# Patient Record
Sex: Female | Born: 1960 | Race: Black or African American | Hispanic: No | Smoking: Never smoker
Health system: Southern US, Community
[De-identification: ages and names within clinical notes are randomized; demographics above are authoritative.]

## PROBLEM LIST (undated history)

## (undated) DIAGNOSIS — I10 Essential (primary) hypertension: Secondary | ICD-10-CM

## (undated) HISTORY — PX: TUBAL LIGATION: SHX77

---

## 2008-12-25 ENCOUNTER — Other Ambulatory Visit: Admission: RE | Admit: 2008-12-25 | Discharge: 2008-12-25 | Payer: Self-pay | Admitting: Family Medicine

## 2009-11-24 ENCOUNTER — Other Ambulatory Visit: Admission: RE | Admit: 2009-11-24 | Discharge: 2009-11-24 | Payer: Self-pay | Admitting: Family Medicine

## 2011-06-11 ENCOUNTER — Observation Stay (HOSPITAL_COMMUNITY)
Admission: EM | Admit: 2011-06-11 | Discharge: 2011-06-12 | Disposition: A | Discharge status: Home / Self Care | Payer: BC Managed Care – PPO | Attending: Emergency Medicine | Admitting: Emergency Medicine

## 2011-06-11 ENCOUNTER — Other Ambulatory Visit: Payer: Self-pay

## 2011-06-11 ENCOUNTER — Encounter (HOSPITAL_COMMUNITY): Payer: Self-pay | Admitting: Emergency Medicine

## 2011-06-11 DIAGNOSIS — I10 Essential (primary) hypertension: Secondary | ICD-10-CM | POA: Insufficient documentation

## 2011-06-11 DIAGNOSIS — R079 Chest pain, unspecified: Principal | ICD-10-CM | POA: Insufficient documentation

## 2011-06-11 DIAGNOSIS — E876 Hypokalemia: Secondary | ICD-10-CM

## 2011-06-11 HISTORY — DX: Essential (primary) hypertension: I10

## 2011-06-11 NOTE — ED Notes (Signed)
Patient with tightness in chest for last few days, states that she did go to PCP and it has continued and getting worse.  She states that she has some fatigue with the tightness.

## 2011-06-12 ENCOUNTER — Other Ambulatory Visit: Payer: Self-pay

## 2011-06-12 ENCOUNTER — Emergency Department (HOSPITAL_COMMUNITY): Payer: BC Managed Care – PPO

## 2011-06-12 LAB — POCT I-STAT TROPONIN I
Troponin i, poc: 0 ng/mL (ref 0.00–0.08)
Troponin i, poc: 0 ng/mL (ref 0.00–0.08)

## 2011-06-12 LAB — POCT I-STAT, CHEM 8
BUN: 10 mg/dL (ref 6–23)
Calcium, Ion: 1.2 mmol/L (ref 1.12–1.32)
Chloride: 107 mEq/L (ref 96–112)
Creatinine, Ser: 0.8 mg/dL (ref 0.50–1.10)
Glucose, Bld: 109 mg/dL — ABNORMAL HIGH (ref 70–99)
HCT: 34 % — ABNORMAL LOW (ref 36.0–46.0)
Hemoglobin: 11.6 g/dL — ABNORMAL LOW (ref 12.0–15.0)
Potassium: 3 mEq/L — ABNORMAL LOW (ref 3.5–5.1)
Sodium: 141 mEq/L (ref 135–145)
TCO2: 23 mmol/L (ref 0–100)

## 2011-06-12 LAB — CBC
HCT: 32.6 % — ABNORMAL LOW (ref 36.0–46.0)
Hemoglobin: 11.1 g/dL — ABNORMAL LOW (ref 12.0–15.0)
MCH: 28.8 pg (ref 26.0–34.0)
MCHC: 34 g/dL (ref 30.0–36.0)
MCV: 84.7 fL (ref 78.0–100.0)
Platelets: 283 10*3/uL (ref 150–400)
RBC: 3.85 MIL/uL — ABNORMAL LOW (ref 3.87–5.11)
RDW: 14.9 % (ref 11.5–15.5)
WBC: 7.8 10*3/uL (ref 4.0–10.5)

## 2011-06-12 LAB — DIFFERENTIAL
Basophils Relative: 0 % (ref 0–1)
Eosinophils Absolute: 0.3 10*3/uL (ref 0.0–0.7)
Eosinophils Relative: 3 % (ref 0–5)
Neutrophils Relative %: 59 % (ref 43–77)

## 2011-06-12 LAB — PROTIME-INR
INR: 1.04 (ref 0.00–1.49)
Prothrombin Time: 13.8 seconds (ref 11.6–15.2)

## 2011-06-12 LAB — APTT: aPTT: 32 seconds (ref 24–37)

## 2011-06-12 MED ORDER — POTASSIUM CHLORIDE CRYS ER 20 MEQ PO TBCR
40.0000 meq | EXTENDED_RELEASE_TABLET | Freq: Once | ORAL | Status: AC
  Administered 2011-06-12: 40 meq via ORAL
  Filled 2011-06-12: qty 2

## 2011-06-12 MED ORDER — SODIUM CHLORIDE 0.9 % IV SOLN
Freq: Once | INTRAVENOUS | Status: AC
  Administered 2011-06-12: 75 mL/h via INTRAVENOUS

## 2011-06-12 MED ORDER — MORPHINE SULFATE 4 MG/ML IJ SOLN
4.0000 mg | INTRAMUSCULAR | Status: DC | PRN
  Filled 2011-06-12: qty 1

## 2011-06-12 MED ORDER — MORPHINE SULFATE 4 MG/ML IJ SOLN: 2.0000 mg | Freq: Once | INTRAMUSCULAR | Status: DC

## 2011-06-12 MED ORDER — ASPIRIN 81 MG PO CHEW
324.0000 mg | CHEWABLE_TABLET | Freq: Once | ORAL | Status: AC
Start: 2011-06-12 — End: 2011-06-12
  Administered 2011-06-12: 324 mg via ORAL
  Filled 2011-06-12: qty 4

## 2011-06-12 NOTE — ED Provider Notes (Signed)
Assume patient care at 7 AM. Patient is in CDU under chest pain protocol. Plans for  Stress echo test this a.m.  patient states she had an uneventful night last night. Denies chest pain, shortness of breath, or abdominal pain. On exam, patient is alert and oriented and in no acute distress. Heart is with regular rate and rhythm without murmur rubs or gallops, lungs clear to auscultation bilaterally, abdomen is soft and nontender, palpable distal pulse bilaterally.  I have contacted Dr. Diona Browner, who is on call for [facility] cardiology. Dr. Diona Browner who will be responsible in reading the stress echo report.  7:49 AM Discussed patient care with my attending, Dr. Eber Hong at 7:49 AM. The plan was to schedule a coronary CT for further evaluation. The plan is not to have cardiac stress test in the weekend. However, patient has a BMI of 35.9, which disqualified her from having a coronary CT. Furthermore, patient did not take Metoprolol this AM.  Dr. Hyacinth Meeker is aware, and recommend to discharge patient if 3 sets of serial cardiac enzymes are negative.  8:28 AM   Pt has no ECG changes from 3 serial ECGs, pt also has no Troponin changes serially.  Therefore, the likelihood of her chest pain as related to cardiac etiology is low.  At this time, pt will be discharge with recommendation to follow up with her PCP, Dr. Concepcion Elk for further management.  Pt was made aware that her potassium level is low and recommend recheck at her PCP.  K+ supplementation given here today.  Pt instruct to f/u with cardiology on Monday.  Fayrene Helper, PA-C 06/12/11 1610  Fayrene Helper, PA-C 06/12/11 [phone]

## 2011-06-12 NOTE — ED Notes (Signed)
Pt states that she has been feeling tired for the past several days. Pt states that she has not had CP nor has she been nauseated, diaphoretic or SOB. Pt states that she has been exercising more for the past 2 weeks and that she has moments when she just feels tired. Pt states that she at times she feels like she has CP but not sharp, just dull. Pt alert and oriented and able to follow commands and move extremities. Pt states no cardiac history. Pt states father had MI at age 32, but is still living.

## 2011-06-12 NOTE — ED Notes (Signed)
Pt aware that morphine is ordered. Pt does not want morphine at this time. Pt given call bell and told to call if she changes her mind or the pain gets worse.

## 2011-06-12 NOTE — ED Notes (Signed)
Received report assumed patient care  Patient on CP protocol.

## 2011-06-12 NOTE — ED Notes (Signed)
Report given to Diane RN in CDU. Pt aware of transfer to CDU. EDP aware that pt is in CDU.

## 2011-06-12 NOTE — Discharge Instructions (Signed)
Please follow up with [facility] on Monday for further evaluation.  Return to ER if your symptoms worsen.  Your potassium level is low today, please have your potassium recheck by your doctor at your earliest convenient.  Chest Pain Observation It is often hard to give a specific diagnosis for the cause of chest pain. Your symptoms had a chance of being caused by inadequate oxygen delivery to your heart (angina). Angina that is not treated or evaluated can lead to a heart attack (myocardial infarction, MI) or death. Blood tests, electrocardiograms, and X-rays may have been done to help determine a possible cause of your chest pain. After evaluation and observation, your caregiver has determined that it is unlikely your pain was caused by angina. However, a full evaluation of your pain needs to be completed. You need to follow up with caregivers or diagnostic testing as directed. It is very important to keep your follow-up appointments. Not keeping your follow-up appointments could result in permanent heart damage, disability, or death. If there is any problem keeping your follow-up appointments, you must call your caregiver. HOME CARE INSTRUCTIONS  Due to the slight chance that your pain could be angina, it is important to follow healthy lifestyle habits and follow your caregiver's treatment plan:  Maintain a healthy weight.   Stay physically active and exercise regularly.   Decrease your salt intake.   Eat a diet low in saturated fats and cholesterol. Avoid foods fried in oil or made with fat. Talk to a dietician to learn about heart healthy foods.   Increase your fiber intake by including whole grains, vegetable, and fruits in your diet.   Avoid situations that cause stress, anger, or depression.   Take medication as advised by your caregiver. Report any side effects to your caregiver. Do not stop medications or adjust the dosages on your own.   Quit smoking. Do not use nicotine  patches or gum until you check with your caregiver.   Keep your blood pressure, blood sugar, and cholesterol levels within normal limits.   Limit alcohol intake to no more than 1 drink per day for nonpregnant women and 2 drinks per day for men.   Stop abusing drugs.  SEEK MEDICAL CARE IF: You have severe chest pain or pressure which may include symptoms such as:  Pain or pressure in the arms, neck, jaw, or back.   Profuse sweating.   Feeling sick to your stomach (nauseous).   Feeling short of breath while at rest.   Having a fast or irregular heartbeat.   You have chest pain that does not get better after rest or after taking your usual medicine.   You wake from sleep with chest pain.   You feel dizzy, faint, or experience extreme fatigue.   You notice increasing shortness of breath during rest, sleep, or with activity.   You are unable to sleep because you cannot breathe.   You develop a frequent cough or you are coughing up blood.   You have severe back or abdominal pain, are nauseated, or throw up (vomit).   You develop severe weakness, dizziness, fainting, or chills.  Any of these symptoms may represent a serious problem that is an emergency. Do not wait to see if the symptoms will go away. Call your local emergency services (911 in the [place] Do not drive yourself to the hospital. MAKE SURE YOU:  Understand these instructions.   Will watch your condition.   Will get help right away if  you are not doing well or get worse.  Document Released: 05/08/2010 Document Revised: 12/16/2010 Document Reviewed: 05/08/2010 [facility] Patient Information 2012 [place], [place].  Hypokalemia Hypokalemia means a low potassium level in the blood.Potassium is an electrolyte that helps regulate the amount of fluid in the body. It also stimulates muscle contraction and maintains a stable acid-base balance.Most of the body's potassium is inside of cells, and only a very small amount is in  the blood. Because the amount in the blood is so small, minor changes can have big effects. PREPARATION FOR TEST Testing for potassium requires taking a blood sample taken by needle from a vein in the arm. The skin is cleaned thoroughly before the sample is drawn. There is no other special preparation needed. NORMAL VALUES Potassium levels below 3.5 mEq/L are abnormally low. Levels above 5.1 mEq/L are abnormally high. Ranges for normal findings may vary among different laboratories and hospitals. You should always check with your doctor after having lab work or other tests done to discuss the meaning of your test results and whether your values are considered within normal limits. MEANING OF TEST  Your caregiver will go over the test results with you and discuss the importance and meaning of your results, as well as treatment options and the need for additional tests, if necessary. A potassium level is frequently part of a routine medical exam. It is usually included as part of a whole "panel" of tests for several blood salts (such as Sodium and Chloride). It may be done as part of follow-up when a low potassium level was found in the past or other blood salts are suspected of being out of balance. A low potassium level might be suspected if you have one or more of the following:  Symptoms of weakness.   Abnormal heart rhythms.   High blood pressure and are taking medication to control this, especially water pills (diuretics).   Kidney disease that can affect your potassium level .   Diabetes requiring the use of insulin. The potassium may fall after taking insulin, especially if the diabetes had been out of control for a while.   A condition requiring the use of cortisone-type medication or certain types of antibiotics.   Vomiting and/or diarrhea for more than a day or two.   A stomach or intestinal condition that may not permit appropriate absorption of potassium.   Fainting episodes.     Mental confusion.  OBTAINING TEST RESULTS It is your responsibility to obtain your test results. Ask the lab or department performing the test when and how you will get your results.  Please contact your caregiver directly if you have not received the results within one week. At that time, ask if there is anything different or new you should be doing in relation to the results. TREATMENT Hypokalemia can be treated with potassium supplements taken by mouth and/or adjustments in your current medications. A diet high in potassium is also helpful. Foods with high potassium content are:  Peas, lentils, lima beans, nuts, and dried fruit.   Whole grain and bran cereals and breads.   Fresh fruit, vegetables (bananas, cantaloupe, grapefruit, oranges, tomatoes, honeydew melons, potatoes).   Orange and tomato juices.   Meats. If potassium supplement has been prescribed for you today or your medications have been adjusted, see your personal caregiver in time02 for a re-check.  SEEK MEDICAL CARE IF:  There is a feeling of worsening weakness.   You experience repeated chest palpitations.  You are diabetic and having difficulty keeping your blood sugars in the normal range.   You are experiencing vomiting and/or diarrhea.   You are having difficulty with any of your regular medications.  SEEK IMMEDIATE MEDICAL CARE IF:  You experience chest pain, shortness of breath, or episodes of dizziness.   You have been having vomiting or diarrhea for more than 2 days.   You have a fainting episode.  MAKE SURE YOU:   Understand these instructions.   Will watch your condition.   Will get help right away if you are not doing well or get worse.  Document Released: 04/05/2005 Document Revised: 12/16/2010 Document Reviewed: 03/16/2008 [facility] Patient Information 2012 [place], [place].

## 2011-06-12 NOTE — ED Notes (Addendum)
Height 5'9"  Weight 240.9   BMI = 35.6

## 2011-06-12 NOTE — ED Provider Notes (Signed)
History     CSN: [id number]  Arrival date & time 06/11/11  2241   First MD Initiated Contact with Patient 06/11/11 2335      Chief Complaint  Patient presents with  . Chest Pain    (Consider location/radiation/quality/duration/timing/severity/associated sxs/prior treatment) HPI Comments: 51 year old female with history of hypertension presents with a complaint of intermittent substernal heaviness over the last 2 days. She states that this is not exertional or positional it is not related to eating. She's never had this sensation before. Currently the symptoms are mild they are not associated with shortness of breath nausea or diaphoresis but there is some associated intermittent left arm pain. She does note seen a physician for the first time in a long time on Tuesday at which time her antihypertensive medications were changed in dosing but not type. She has never had a heart stress or catheterization.    Patient is a 51 y.o. female presenting with chest pain. The history is provided by the patient.  Chest Pain     Past Medical History  Diagnosis Date  . Hypertension     History reviewed. No pertinent past surgical history.  History reviewed. No pertinent family history.  History  Substance Use Topics  . Smoking status: Never Smoker   . Smokeless tobacco: Not on file  . Alcohol Use: No    OB History    Grav Para Term Preterm Abortions TAB SAB Ect Mult Living                  Review of Systems  Cardiovascular: Positive for chest pain.  All other systems reviewed and are negative.    Allergies  Septra  Home Medications   Current Outpatient Rx  Name Route Sig Dispense Refill  . ASPIRIN EC 81 MG PO TBEC Oral Take 81 mg by mouth daily.    Marland Kitchen LISINOPRIL-HYDROCHLOROTHIAZIDE 20-12.5 MG PO TABS Oral Take 1 tablet by mouth daily.      BP 104/68  Pulse 78  Temp(Src) 98 F (36.7 C) (Oral)  Resp 16  Ht 5\' 9"  (1.753 m)  Wt 240 lb 9 oz (109.118 kg)  BMI 35.52  kg/m2  SpO2 97%  LMP 05/14/2011  Physical Exam  Nursing note and vitals reviewed. Constitutional: She appears well-developed and well-nourished. No distress.  HENT:  Head: Normocephalic and atraumatic.  Mouth/Throat: Oropharynx is clear and moist. No oropharyngeal exudate.  Eyes: Conjunctivae and EOM are normal. Pupils are equal, round, and reactive to light. Right eye exhibits no discharge. Left eye exhibits no discharge. No scleral icterus.  Neck: Normal range of motion. Neck supple. No JVD present. No thyromegaly present.  Cardiovascular: Normal rate, regular rhythm, normal heart sounds and intact distal pulses.  Exam reveals no gallop and no friction rub.   No murmur heard. Pulmonary/Chest: Effort normal and breath sounds normal. No respiratory distress. She has no wheezes. She has no rales.  Abdominal: Soft. Bowel sounds are normal. She exhibits no distension and no mass. There is no tenderness.  Musculoskeletal: Normal range of motion. She exhibits no edema and no tenderness.  Lymphadenopathy:    She has no cervical adenopathy.  Neurological: She is alert. Coordination normal.  Skin: Skin is warm and dry. No rash noted. No erythema.  Psychiatric: She has a normal mood and affect. Her behavior is normal.    ED Course  Procedures (including critical care time)  ED ECG REPORT   Date: 06/12/2011   Rate: 74  Rhythm: normal sinus  rhythm  QRS Axis: normal  Intervals: normal  ST/T Wave abnormalities: nonspecific T wave changes  Conduction Disutrbances:none  Narrative Interpretation:   Old EKG Reviewed: none available      Labs Reviewed  CBC - Abnormal; Notable for the following:    RBC 3.85 (*)    Hemoglobin 11.1 (*)    HCT 32.6 (*)    All other components within normal limits  POCT I-STAT, CHEM 8 - Abnormal; Notable for the following:    Potassium 3.0 (*)    Glucose, Bld 109 (*)    Hemoglobin 11.6 (*)    HCT 34.0 (*)    All other components within normal limits    DIFFERENTIAL  APTT  PROTIME-INR  POCT I-STAT TROPONIN I  POCT I-STAT TROPONIN I   Dg Chest Port 1 View  06/12/2011  *RADIOLOGY REPORT*  Clinical Data: Chest pain.  PORTABLE CHEST - 1 VIEW  Comparison: None  Findings: Heart is borderline in size.  Lungs are clear.  No effusions or edema.  No acute bony abnormality.  IMPRESSION: Borderline heart size.  No active disease.  Original Report Authenticated By: Cyndie Chime, M.D.     No diagnosis found.    MDM  Well-appearing female with normal vital signs and minimally nonspecific EKG. We'll proceed with cardiac workup, may be good candidate for CDU observation protocol. She did take 81 mg of aspirin prior to arrival. She does not take daily asa      ED Course:  Overnight the patient had no complaints of chest pain, repeat EKGs have been unremarkable, troponins negative   Labarotory results reviewed:  Troponins negative, lab work normal   Radiology imaging reviewed:  I have personally reviewed the chest x-ray, Chest x-ray without acute findings per my reading and radiologist   Previous Records reviewed: No significant medical records pertinent to visit   Medications given in ED: ASA, Morphine  The pt and (or) family members were informed of results and need for close follow up    Disposition:  Pending at change of shift - care signed out to Dr. Patrica Duel and PA Bonnita Nasuti, MD 06/12/11 [phone]

## 2011-06-13 NOTE — ED Provider Notes (Signed)
Medical screening examination/treatment/procedure(s) were performed by non-physician practitioner and as supervising physician I was immediately available for consultation/collaboration.   Geoffery Lyons, MD 06/13/11 1106

## 2011-06-28 NOTE — Progress Notes (Signed)
Observation review for February visit is complete. 

## 2012-11-27 ENCOUNTER — Encounter (HOSPITAL_COMMUNITY): Payer: Self-pay | Admitting: *Deleted

## 2012-11-27 ENCOUNTER — Encounter (HOSPITAL_COMMUNITY): Payer: Self-pay | Admitting: Emergency Medicine

## 2012-11-27 ENCOUNTER — Inpatient Hospital Stay (HOSPITAL_COMMUNITY)
Admission: AD | Admit: 2012-11-27 | Discharge: 2012-11-28 | Disposition: A | Discharge status: Home / Self Care | Payer: BC Managed Care – PPO | Source: Ambulatory Visit | Attending: Obstetrics & Gynecology | Admitting: Obstetrics & Gynecology

## 2012-11-27 ENCOUNTER — Emergency Department (HOSPITAL_COMMUNITY)
Admission: EM | Admit: 2012-11-27 | Discharge: 2012-11-27 | Disposition: A | Discharge status: Home / Self Care | Payer: BC Managed Care – PPO | Source: Home / Self Care | Attending: Family Medicine | Admitting: Family Medicine

## 2012-11-27 DIAGNOSIS — N949 Unspecified condition associated with female genital organs and menstrual cycle: Secondary | ICD-10-CM

## 2012-11-27 DIAGNOSIS — R102 Pelvic and perineal pain: Secondary | ICD-10-CM

## 2012-11-27 DIAGNOSIS — D250 Submucous leiomyoma of uterus: Secondary | ICD-10-CM | POA: Insufficient documentation

## 2012-11-27 DIAGNOSIS — R1031 Right lower quadrant pain: Secondary | ICD-10-CM

## 2012-11-27 LAB — POCT URINALYSIS DIP (DEVICE)
Bilirubin Urine: NEGATIVE
Ketones, ur: NEGATIVE mg/dL
Leukocytes, UA: NEGATIVE
Nitrite: NEGATIVE

## 2012-11-27 NOTE — MAU Note (Signed)
Pt. Here for lower abdominal pain and right sided flank pain. Was just seen in [facility] and they referred her to here. Pain in stomach started 2.5 weeks ago. Severe pain started last night. Denies nausea or vomiting. Denies any bleeding.

## 2012-11-27 NOTE — ED Notes (Signed)
Reports right lower quadrant pain x almost 2 1/2 weeks. Pt states last night the pain was more severe.  Pain radiates from RLQ to right flank and back.  Pt denies urinary symptoms. Fever, n/v/d and injury.

## 2012-11-27 NOTE — ED Provider Notes (Addendum)
  CSN: [id number]     Arrival date & time 11/27/12  1820 History     None    Chief Complaint  Patient presents with  . Abdominal Pain   (Consider location/radiation/quality/duration/timing/severity/associated sxs/prior Treatment) Patient is a 52 y.o. female presenting with abdominal pain. The history is provided by the patient.  Abdominal Pain This is a new problem. The current episode started more than 1 week ago (2.5 -3 weeks.). Progression since onset: onset 1 week before onset of menses first since april, lighter than nl. , sx worse 9.5/10 last eve, Associated symptoms include abdominal pain. Pertinent negatives include no chest pain.    Past Medical History  Diagnosis Date  . Hypertension    History reviewed. No pertinent past surgical history. History reviewed. No pertinent family history. History  Substance Use Topics  . Smoking status: Never Smoker   . Smokeless tobacco: Not on file  . Alcohol Use: No   OB History   Grav Para Term Preterm Abortions TAB SAB Ect Mult Living                 Review of Systems  Constitutional: Negative.  Negative for fever and chills.  Cardiovascular: Negative for chest pain.  Gastrointestinal: Positive for abdominal pain. Negative for nausea, vomiting, diarrhea, constipation and blood in stool.  Genitourinary: Positive for menstrual problem and pelvic pain. Negative for vaginal discharge and vaginal pain.    Allergies  Septra  Home Medications   Current Outpatient Rx  Name  Route  Sig  Dispense  Refill  . lisinopril-hydrochlorothiazide (PRINZIDE,ZESTORETIC) 20-12.5 MG per tablet   Oral   Take 1 tablet by mouth daily.         Marland Kitchen aspirin EC 81 MG tablet   Oral   Take 81 mg by mouth daily.          BP 157/78  Pulse 74  Temp(Src) 97.7 F (36.5 C) (Oral)  Resp 18  SpO2 100%  LMP 11/23/2012 Physical Exam  Abdominal: Soft. Bowel sounds are normal. She exhibits no distension and no mass. There is no hepatosplenomegaly.  There is tenderness in the right lower quadrant and suprapubic area. There is no rigidity, no rebound, no guarding and no CVA tenderness.      ED Course   Procedures (including critical care time)  Labs Reviewed  POCT URINALYSIS DIP (DEVICE)   No results found. 1. Pelvic pain in female     MDM    Linna Hoff, MD 11/27/12 Lori Snow  Linna Hoff, MD 12/02/12 1006

## 2012-11-28 ENCOUNTER — Inpatient Hospital Stay (HOSPITAL_COMMUNITY): Payer: BC Managed Care – PPO

## 2012-11-28 ENCOUNTER — Encounter (HOSPITAL_COMMUNITY): Payer: Self-pay

## 2012-11-28 DIAGNOSIS — R1031 Right lower quadrant pain: Secondary | ICD-10-CM

## 2012-11-28 LAB — CBC
HCT: 34.2 % — ABNORMAL LOW (ref 36.0–46.0)
MCH: 28.6 pg (ref 26.0–34.0)
MCV: 83.6 fL (ref 78.0–100.0)
Platelets: 281 10*3/uL (ref 150–400)
RBC: 4.09 MIL/uL (ref 3.87–5.11)
RDW: 14.4 % (ref 11.5–15.5)

## 2012-11-28 LAB — WET PREP, GENITAL
Trich, Wet Prep: NONE SEEN
Yeast Wet Prep HPF POC: NONE SEEN

## 2012-11-28 LAB — GC/CHLAMYDIA PROBE AMP: CT Probe RNA: NEGATIVE

## 2012-11-28 MED ORDER — KETOROLAC TROMETHAMINE 10 MG PO TABS
10.0000 mg | ORAL_TABLET | Freq: Once | ORAL | Status: DC
  Filled 2012-11-28: qty 1

## 2012-11-28 MED ORDER — IOHEXOL 300 MG/ML  SOLN: 50.0000 mL | INTRAMUSCULAR | Status: AC

## 2012-11-28 MED ORDER — IOHEXOL 300 MG/ML  SOLN
100.0000 mL | Freq: Once | INTRAMUSCULAR | Status: AC | PRN
  Administered 2012-11-28: 100 mL via INTRAVENOUS

## 2012-11-28 MED ORDER — OXYCODONE-ACETAMINOPHEN 5-325 MG PO TABS: 2.0000 | ORAL_TABLET | Freq: Once | ORAL | Status: DC

## 2012-11-28 MED ORDER — LACTATED RINGERS IV SOLN: INTRAVENOUS | Status: DC

## 2012-11-28 MED ORDER — KETOROLAC TROMETHAMINE 10 MG PO TABS: 10.0000 mg | ORAL_TABLET | Freq: Four times a day (QID) | ORAL | Status: AC | PRN

## 2012-11-28 MED ORDER — TRAMADOL HCL 50 MG PO TABS: 50.0000 mg | ORAL_TABLET | Freq: Four times a day (QID) | ORAL | Status: AC | PRN

## 2012-11-28 MED ORDER — SODIUM CHLORIDE 0.9 % IJ SOLN
INTRAMUSCULAR | Status: AC
  Administered 2012-11-28: 3 mL
  Filled 2012-11-28: qty 3

## 2012-11-28 MED ORDER — IOHEXOL 300 MG/ML  SOLN
50.0000 mL | INTRAMUSCULAR | Status: AC
  Administered 2012-11-28: 50 mL via ORAL

## 2012-11-28 NOTE — MAU Provider Note (Signed)
Chief Complaint: No chief complaint on file.   First Provider Initiated Contact with Patient 11/28/12 0001     SUBJECTIVE HPI: Lori Snow is a 52 y.o. [id number] female who presents with intermittent low abd pain wrapping around to right side x 2.5 weeks, worse over past 24 hours. 9/10 at worst. Has not tried anything for the pain. Better w/ position changes. Denies N/V/D/C (2 BM's per day as usual, but firmer), fever, chills, urinary complaints, intermenstrual bleeding. Patient's last menstrual period was 11/23/2012. Last IC 07/2012. UA neg at Urgent care this afternoon.  Past Medical History  Diagnosis Date  . Hypertension    OB History   Grav Para Term Preterm Abortions TAB SAB Ect Mult Living   5 5 5       5      # Outc Date GA Lbr Len/2nd Wgt Sex Del Anes PTL Lv   1 TRM            2 TRM            3 TRM            4 TRM            5 TRM              Past Surgical History  Procedure Laterality Date  . Tubal ligation     History   Social History  . Marital Status: Legally Separated    Spouse Name: N/A    Number of Children: N/A  . Years of Education: N/A   Occupational History  . Not on file.   Social History Main Topics  . Smoking status: Never Smoker   . Smokeless tobacco: Not on file  . Alcohol Use: No  . Drug Use: No  . Sexually Active: Yes   Other Topics Concern  . Not on file   Social History Narrative  . No narrative on file   No current facility-administered medications on file prior to encounter.   Current Outpatient Prescriptions on File Prior to Encounter  Medication Sig Dispense Refill  . lisinopril-hydrochlorothiazide (PRINZIDE,ZESTORETIC) 20-12.5 MG per tablet Take 1 tablet by mouth 2 (two) times daily.       Marland Kitchen aspirin EC 81 MG tablet Take 81 mg by mouth daily.       Allergies  Allergen Reactions  . Septra (Bactrim) Rash    "low platelet count"    ROS: Pertinent items in HPI  OBJECTIVE Blood pressure 149/85, temperature 97.7 F (36.5  C), temperature source Oral, height 5\' 9"  (1.753 m), weight 107.049 kg (236 lb), last menstrual period 11/23/2012. GENERAL: Well-developed, well-nourished female in mild-moderate distress.  HEENT: Normocephalic HEART: normal rate RESP: normal effort ABDOMEN: Soft, moderate right lower quadrant tenderness with questionable rebound tenderness. Positive guarding. No mass palpated positive bowel sounds x4. EXTREMITIES: Nontender, no edema NEURO: Alert and oriented SPECULUM EXAM: NEFG, physiologic discharge, no blood noted, cervix clean BIMANUAL: cervix closed; uterus normal size, no adnexal tenderness or masses. No cervical motion tenderness.  LAB RESULTS Results for orders placed during the hospital encounter of 11/27/12 (from the past 24 hour(s))  CBC     Status: Abnormal   Collection Time    11/28/12 12:30 AM      Result Value Range   WBC 9.6  4.0 - 10.5 K/uL   RBC 4.09  3.87 - 5.11 MIL/uL   Hemoglobin 11.7 (*) 12.0 - 15.0 g/dL   HCT 01.0 (*) 27.2 - 53.6 %  MCV 83.6  78.0 - 100.0 fL   MCH 28.6  26.0 - 34.0 pg   MCHC 34.2  30.0 - 36.0 g/dL   RDW 16.1  09.6 - 04.5 %   Platelets 281  150 - 400 K/uL  POCT PREGNANCY, URINE     Status: None   Collection Time    11/28/12 12:31 AM      Result Value Range   Preg Test, Ur NEGATIVE  NEGATIVE  WET PREP, GENITAL     Status: Abnormal   Collection Time    11/28/12  1:09 AM      Result Value Range   Yeast Wet Prep HPF POC NONE SEEN  NONE SEEN   Trich, Wet Prep NONE SEEN  NONE SEEN   Clue Cells Wet Prep HPF POC NONE SEEN  NONE SEEN   WBC, Wet Prep HPF POC FEW (*) NONE SEEN    IMAGING Ct Abdomen Pelvis W Contrast  11/28/2012   *RADIOLOGY REPORT*  Clinical Data: Right-sided flank and right lower quadrant pain.  CT ABDOMEN AND PELVIS WITH CONTRAST  Technique:  Multidetector CT imaging of the abdomen and pelvis was performed following the standard protocol during bolus administration of intravenous contrast.  Contrast: OMNIPAQUE IOHEXOL  300 MG/ML  SOLN  Comparison: No priors.  Findings:  Lung Bases: Unremarkable.  Abdomen/Pelvis:  The appearance of the liver, gallbladder, pancreas, spleen, bilateral adrenal glands and the left kidney is unremarkable.  There is a right-sided pelvic kidney (a normal anatomical variant).  Minimal fullness of the right renal collecting system, without frank hydronephrosis.  Normal appendix. A trace volume of free fluid the cul-de-sac may be physiologic.  No larger volume of ascites.  No pneumoperitoneum.  No pathologic distension of small bowel.  No definite pathologic lymphadenopathy identified within the abdomen or pelvis on today's CT examination. Extending from the left side of the uterine body there is a 5.0 x 4.1 x 4.5 cm heterogeneously enhancing lesion which likely represents a large fibroid.  Ovaries are unremarkable in appearance.  Musculoskeletal: There are no aggressive appearing lytic or blastic lesions noted in the visualized portions of the skeleton.  IMPRESSION: 1.  No acute findings in the abdomen or pelvis to account for the patient's symptoms. 2.  Specifically, the appendix is normal. 3.  Right-sided pelvic kidney with minimal fullness of the right renal collecting system. 4.  Probable submucosal fibroid extending off the left side of the uterine body measuring approximately 5.0 x 4.1 x 4.5 cm.   Original Report Authenticated By: Trudie Reed, M.D.    MAU COURSE [phone]: Pt does not have time to stay for CT because she has to pick her daughter up at 0600. Pt stable for D/C. Afebrile. No Leukocytosis, GI Sx. Needs to come back ASAP for CT or for worsening Sx. Pt agrees.  Declines pain medication.  0330: Pt decided to stay for CT. Discharge order cancelled. Discussed w/ Radiology.    ASSESSMENT 1. RLQ abdominal pain    normal appendix per CT.  PLAN Discharge home in stable condition.  Follow-up Information   Follow up with Primary care provider. (As needed if no improvement.)        Follow up with [facility]. (As needed if symptoms worsen)    Contact information:   [address] [place] [address]         Medication List         aspirin EC 81 MG tablet  Take 81 mg by  mouth daily.     fish oil-omega-3 fatty acids 1000 MG capsule  Take 2 g by mouth 3 (three) times daily.     ketorolac 10 MG tablet  Commonly known as:  TORADOL  Take 1 tablet (10 mg total) by mouth every 6 (six) hours as needed for pain.     lisinopril-hydrochlorothiazide 10-12.5 MG per tablet  Commonly known as:  PRINZIDE,ZESTORETIC  Take 1 tablet by mouth 2 (two) times daily.     traMADol 50 MG tablet  Commonly known as:  ULTRAM  Take 1 tablet (50 mg total) by mouth every 6 (six) hours as needed for pain.       Dorathy Kinsman, CNM 11/28/2012  12:00 AM

## 2012-11-30 NOTE — MAU Provider Note (Signed)
Attestation of Attending Supervision of Advanced Practitioner (PA/CNM/NP): Evaluation and management procedures were performed by the Advanced Practitioner under my supervision and collaboration.  I have reviewed the Advanced Practitioner's note and chart, and I agree with the management and plan.  Siyah Mault, MD, FACOG Attending Obstetrician & Gynecologist Faculty Practice, [facility]  

## 2012-12-11 ENCOUNTER — Encounter: Payer: Self-pay | Admitting: Obstetrics

## 2013-01-01 ENCOUNTER — Ambulatory Visit: Payer: Self-pay | Admitting: Obstetrics & Gynecology

## 2013-08-08 ENCOUNTER — Encounter: Payer: BC Managed Care – PPO | Admitting: Gastroenterology

## 2014-02-18 ENCOUNTER — Encounter (HOSPITAL_COMMUNITY): Payer: Self-pay

## 2014-05-09 ENCOUNTER — Encounter (HOSPITAL_COMMUNITY): Payer: Self-pay | Admitting: *Deleted

## 2014-05-09 ENCOUNTER — Emergency Department (HOSPITAL_COMMUNITY)
Admission: EM | Admit: 2014-05-09 | Discharge: 2014-05-09 | Disposition: A | Discharge status: Home / Self Care | Payer: 59 | Attending: Emergency Medicine | Admitting: Emergency Medicine

## 2014-05-09 DIAGNOSIS — I10 Essential (primary) hypertension: Secondary | ICD-10-CM | POA: Diagnosis not present

## 2014-05-09 DIAGNOSIS — R079 Chest pain, unspecified: Secondary | ICD-10-CM | POA: Diagnosis present

## 2014-05-09 DIAGNOSIS — Z79899 Other long term (current) drug therapy: Secondary | ICD-10-CM | POA: Diagnosis not present

## 2014-05-09 DIAGNOSIS — Z7982 Long term (current) use of aspirin: Secondary | ICD-10-CM | POA: Insufficient documentation

## 2014-05-09 LAB — BASIC METABOLIC PANEL
Anion gap: 10 (ref 5–15)
BUN: 11 mg/dL (ref 6–23)
CALCIUM: 9 mg/dL (ref 8.4–10.5)
CHLORIDE: 106 meq/L (ref 96–112)
CO2: 24 mmol/L (ref 19–32)
Creatinine, Ser: 0.8 mg/dL (ref 0.50–1.10)
GFR calc Af Amer: >90 mL/min (ref >90)
GFR, EST NON AFRICAN AMERICAN: 84 mL/min — AB (ref >90)
GLUCOSE: 89 mg/dL (ref 70–99)
Potassium: 3.3 mmol/L — ABNORMAL LOW (ref 3.5–5.1)
Sodium: 140 mmol/L (ref 135–145)

## 2014-05-09 LAB — I-STAT TROPONIN, ED
TROPONIN I, POC: 0 ng/mL (ref 0.00–0.08)
Troponin i, poc: 0 ng/mL (ref 0.00–0.08)

## 2014-05-09 LAB — CBC
HEMATOCRIT: 33.6 % — AB (ref 36.0–46.0)
HEMOGLOBIN: 11.4 g/dL — AB (ref 12.0–15.0)
MCH: 28.2 pg (ref 26.0–34.0)
MCHC: 33.9 g/dL (ref 30.0–36.0)
MCV: 83.2 fL (ref 78.0–100.0)
Platelets: 242 10*3/uL (ref 150–400)
RBC: 4.04 MIL/uL (ref 3.87–5.11)
RDW: 14.8 % (ref 11.5–15.5)
WBC: 5.8 10*3/uL (ref 4.0–10.5)

## 2014-05-09 MED ORDER — ASPIRIN 81 MG PO CHEW
324.0000 mg | CHEWABLE_TABLET | Freq: Once | ORAL | Status: AC
  Administered 2014-05-09: 324 mg via ORAL
  Filled 2014-05-09: qty 4

## 2014-05-09 NOTE — ED Notes (Signed)
Pt in c/o chest pain that has been intermittent since Monday, denies other symptoms no distress noted

## 2014-05-09 NOTE — Discharge Instructions (Signed)

## 2014-05-09 NOTE — ED Provider Notes (Signed)
CSN: [id number]     Arrival date & time 05/09/14  1229 History   First MD Initiated Contact with Patient 05/09/14 1256     Chief Complaint  Patient presents with  . Chest Pain     (Consider location/radiation/quality/duration/timing/severity/associated sxs/prior Treatment) HPI   54 y.o. Female complaining of chest pain began Monday eveneing while driving mid lower chest dull pressure nonradiating, improvews with deep breath, no worsening factors.  Pain resolved after couple of minutes.  Pain recurring yesterday evening, went away after few minutes, returned last night and then again this am.  No sob, nausea vomiiting. Light headed.  Taking meds, no smoking.  FH-  Mother had heart failure.  Primary novant  Past Medical History  Diagnosis Date  . Hypertension    Past Surgical History  Procedure Laterality Date  . Tubal ligation     History reviewed. No pertinent family history. History  Substance Use Topics  . Smoking status: Never Smoker   . Smokeless tobacco: Not on file  . Alcohol Use: No   OB History    Gravida Para Term Preterm AB TAB SAB Ectopic Multiple Living   5 5 5       5      Review of Systems  All other systems reviewed and are negative.     Allergies  Septra  Home Medications   Prior to Admission medications   Medication Sig Start Date End Date Taking? Authorizing Provider  aspirin EC 81 MG tablet Take 81 mg by mouth daily.   Yes Historical Provider, MD  fish oil-omega-3 fatty acids 1000 MG capsule Take 2 g by mouth 3 (three) times daily.   Yes Historical Provider, MD  lisinopril-hydrochlorothiazide (PRINZIDE,ZESTORETIC) 20-12.5 MG per tablet Take 2 tablets by mouth daily. 01/24/14  Yes Historical Provider, MD  saccharomyces boulardii (FLORASTOR) 250 MG capsule Take 250 mg by mouth 2 (two) times daily.   Yes Historical Provider, MD  Vitamin C, Calcium Ascorbate, SOLR Take 500 mg by mouth daily.   Yes Historical Provider, MD  ketorolac (TORADOL) 10 MG  tablet Take 1 tablet (10 mg total) by mouth every 6 (six) hours as needed for pain. Patient not taking: Reported on 05/09/2014 11/28/12   Manya Silvas, CNM   BP 130/68 mmHg  Pulse 75  Temp(Src) 98 F (36.7 C) (Oral)  Resp 12  SpO2 100%  LMP 03/10/2014 Physical Exam  Constitutional: She is oriented to person, place, and time. She appears well-developed and well-nourished.  HENT:  Head: Normocephalic and atraumatic.  Right Ear: External ear normal.  Left Ear: External ear normal.  Nose: Nose normal.  Mouth/Throat: Oropharynx is clear and moist.  Eyes: Conjunctivae and EOM are normal. Pupils are equal, round, and reactive to light.  Neck: Normal range of motion. Neck supple.  Cardiovascular: Normal rate, regular rhythm, normal heart sounds and intact distal pulses.   Pulmonary/Chest: Effort normal and breath sounds normal.  Abdominal: Soft. Bowel sounds are normal.  Musculoskeletal: Normal range of motion.  Neurological: She is alert and oriented to person, place, and time. She has normal reflexes.  Skin: Skin is warm and dry.  Psychiatric: She has a normal mood and affect. Her behavior is normal. Judgment and thought content normal.  Nursing note and vitals reviewed.   ED Course  Procedures (including critical care time) Labs Review Labs Reviewed  CBC - Abnormal; Notable for the following:    Hemoglobin 11.4 (*)    HCT 33.6 (*)    All other  components within normal limits  BASIC METABOLIC PANEL - Abnormal; Notable for the following:    Potassium 3.3 (*)    GFR calc non Af Amer 84 (*)    All other components within normal limits  I-STAT TROPOININ, ED  I-STAT TROPOININ, ED    Imaging Review No results found.   EKG Interpretation   Date/Time:  Thursday May 09 2014 12:33:18 EST Ventricular Rate:  73 PR Interval:  140 QRS Duration: 82 QT Interval:  372 QTC Calculation: 409 R Axis:   19 Text Interpretation:  Normal sinus rhythm Nonspecific T wave abnormality   Abnormal ECG Confirmed by Sanjana Folz MD, Andee Poles [phone]) on 05/09/2014 12:57:20  PM      MDM  54 year old female with risk factor of hypertension who presents today with atypical chest pain. EKG without any acutely ischemic changes and troponins normal 2 here. I discussed need for close follow-up with the patient and return precautions and she voices understanding.    Shaune Pollack, MD 05/10/14 0800

## 2017-08-26 ENCOUNTER — Other Ambulatory Visit: Payer: Self-pay | Admitting: Obstetrics and Gynecology

## 2017-08-26 DIAGNOSIS — R928 Other abnormal and inconclusive findings on diagnostic imaging of breast: Secondary | ICD-10-CM

## 2017-09-05 ENCOUNTER — Ambulatory Visit
Admission: RE | Admit: 2017-09-05 | Discharge: 2017-09-05 | Disposition: A | Discharge status: Home / Self Care | Payer: BLUE CROSS/BLUE SHIELD | Source: Ambulatory Visit | Attending: Obstetrics and Gynecology | Admitting: Obstetrics and Gynecology

## 2017-09-05 ENCOUNTER — Other Ambulatory Visit: Payer: Self-pay | Admitting: Obstetrics and Gynecology

## 2017-09-05 DIAGNOSIS — R928 Other abnormal and inconclusive findings on diagnostic imaging of breast: Secondary | ICD-10-CM

## 2020-10-21 ENCOUNTER — Ambulatory Visit (HOSPITAL_COMMUNITY)
Admission: EM | Admit: 2020-10-21 | Discharge: 2020-10-21 | Disposition: A | Discharge status: Home / Self Care | Payer: 59

## 2020-10-21 ENCOUNTER — Other Ambulatory Visit: Payer: Self-pay

## 2020-10-21 ENCOUNTER — Encounter (HOSPITAL_COMMUNITY): Payer: Self-pay

## 2020-10-21 DIAGNOSIS — I10 Essential (primary) hypertension: Secondary | ICD-10-CM

## 2020-10-21 MED ORDER — OLMESARTAN MEDOXOMIL-HCTZ 40-12.5 MG PO TABS: 1.0000 | ORAL_TABLET | Freq: Every day | ORAL | 1 refills | Status: AC

## 2020-10-21 MED ORDER — CLONIDINE HCL 0.1 MG PO TABS
0.2000 mg | ORAL_TABLET | Freq: Once | ORAL | Status: AC
  Administered 2020-10-21: 0.2 mg via ORAL

## 2020-10-21 MED ORDER — CLONIDINE HCL 0.1 MG PO TABS
ORAL_TABLET | ORAL | Status: AC
  Filled 2020-10-21: qty 2

## 2020-10-21 NOTE — ED Triage Notes (Addendum)
Pt reports home BP reading of 190/104. States is out of her BP medication. Denies chest pain, dizziness, headache, visual abnormalities. Pt does report some right side/low back pain starting yesterday.

## 2020-10-21 NOTE — Discharge Instructions (Signed)
Take benicar tablet once daily   Follow up with doctor at upcoming appointment for evaluation of blood pressure  Take blood pressure daily around the same time and take record with you to upcoming appointment  Avoid salt in diet, do not add additional salt to meals  Continue to exercise, recommendations 30 minutes daily   At any point if blood pressure elevated with any associated symptoms of headache, visual changes, chest pain, shortness of breath, nausea, increased heartburn/indigestion, increased back pain please go to the nearest emergency department

## 2020-10-21 NOTE — ED Provider Notes (Signed)
[facility]    CSN: [id number] Arrival date & time: 10/21/20  0857      History   Chief Complaint Chief Complaint  Patient presents with   Hypertension   Back Pain    HPI Lori Snow is a 60 y.o. female.   Patient presents with elevated blood pressure reading at home of 190/104. Has not taken blood pressure medication in one week. Blood pressure medication switched from amlodipine to verapamil at the end of May. Medication making her feel funny and causing lower extremity swelling. PCP aware, per patient will not change medication without evaluation, has upcoming appointment in the next few week, advised to come to Urgent Care. Denies chest pain, shortness of breath, visual changes, dizziness, headache, lightheadedness. Has had some success on benicar for blood pressure management, would like to be restarted on this medication. Has been trying to exercise to lose weight and attest not always being compliant of salt intake and knows her body is salt sensitive. Right sided lower Back pain mentioned in triage is not new symptom and has been intermittently reoccurring since 2019 after she gained weight. Denies trauma, injury, numbness or tingling. Not worsened by anything. Has not attempted treatment.   Past Medical History:  Diagnosis Date   Hypertension     There are no problems to display for this patient.   Past Surgical History:  Procedure Laterality Date   TUBAL LIGATION      OB History     Gravida  5   Para  5   Term  5   Preterm      AB      Living  5      SAB      IAB      Ectopic      Multiple      Live Births               Home Medications    Prior to Admission medications   Medication Sig Start Date End Date Taking? Authorizing Provider  Coenzyme Q10 (CO Q10 PO) Take by mouth.   Yes [provider]  fish oil-omega-3 fatty acids 1000 MG capsule Take 2 g by mouth 3 (three) times daily.   Yes [provider]   HA[place] PO Take by mouth.   Yes [provider]  olmesartan-hydrochlorothiazide (BENICAR HCT) 40-12.5 MG tablet Take 1 tablet by mouth daily. 10/21/20  Yes Lori Snow, Lori Schuller, NP  UNABLE TO FIND Med Name: pt reports taking vitamin D3   Yes [provider]  UNABLE TO FIND Med Name: pt reports taking potassium, magnesium, and bromelain   Yes [provider]  [place] Name: pt reports taking verapamil but unsure of formulation/dose. States caused her to swell in legs- states has been out for about a week.   Yes [provider]  Vitamin C, Calcium Ascorbate, SOLR Take 500 mg by mouth daily.   Yes [provider]  aspirin EC 81 MG tablet Take 81 mg by mouth daily.    [provider]  ketorolac (TORADOL) 10 MG tablet Take 1 tablet (10 mg total) by mouth every 6 (six) hours as needed for pain. Patient not taking: Reported on 05/09/2014 11/28/12   Lori Snow, [place], CNM  saccharomyces boulardii (FLORASTOR) 250 MG capsule Take 250 mg by mouth 2 (two) times daily.    [provider]    Family History History reviewed. No pertinent family history.  Social History Social  History   Tobacco Use   Smoking status: Never  Substance Use Topics   Alcohol use: No   Drug use: No     Allergies   Septra [bactrim]   Review of Systems Review of Systems  Constitutional: Negative.   Respiratory: Negative.    Cardiovascular: Negative.   Musculoskeletal: Negative.   Neurological: Negative.     Physical Exam Triage Vital Signs ED Triage Vitals  Enc Vitals Group     BP 10/21/20 0918 (!) 193/83     Pulse Rate 10/21/20 0918 73     Resp 10/21/20 0918 18     Temp 10/21/20 0918 98.6 F (37 C)     Temp Source 10/21/20 0918 Oral     SpO2 10/21/20 0918 100 %     Weight --      Height --      Head Circumference --      Peak Flow --      Pain Score 10/21/20 0920 4     Pain Loc --      Pain Edu? --      Excl. in [place]? --    No data  found.  Updated Vital Signs BP (!) 190/84 Comment: Lori Haynes NP notified  Pulse 73   Temp 98.6 F (37 C) (Oral)   Resp 18   LMP 03/10/2014   SpO2 100%   Visual Acuity Right Eye Distance:   Left Eye Distance:   Bilateral Distance:    Right Eye Near:   Left Eye Near:    Bilateral Near:     Physical Exam Constitutional:      Appearance: Normal appearance. She is obese.  HENT:     Head: Normocephalic.  Eyes:     Extraocular Movements: Extraocular movements intact.     Conjunctiva/sclera: Conjunctivae normal.     Pupils: Pupils are equal, round, and reactive to light.  Cardiovascular:     Rate and Rhythm: Normal rate and regular rhythm.     Pulses: Normal pulses.     Heart sounds: Normal heart sounds.  Pulmonary:     Effort: Pulmonary effort is normal.     Breath sounds: Normal breath sounds.  Musculoskeletal:        General: Normal range of motion.     Cervical back: Normal range of motion.  Skin:    General: Skin is warm and dry.  Neurological:     General: No focal deficit present.     Mental Status: She is alert and oriented to person, place, and time. Mental status is at baseline.  Psychiatric:        Mood and Affect: Mood normal.        Behavior: Behavior normal.     UC Treatments / Results  Labs (all labs ordered are listed, but only abnormal results are displayed) Labs Reviewed - No data to display  EKG   Radiology No results found.  Procedures Procedures (including critical care time)  Medications Ordered in UC Medications  cloNIDine (CATAPRES) tablet 0.2 mg (0.2 mg Oral Given 10/21/20 1003)    Initial Impression / Assessment and Plan / UC Course  I have reviewed the triage vital signs and the nursing notes.  Pertinent labs & imaging results that were available during my care of the patient were reviewed by me and considered in my medical decision making (see chart for details).  Elevated blood pressure with diagnosis of HTN  PCP appointments  reviewed from the last year  Clonidine  0.2 mg, BP checked after 20 minutes no real changes, will restart BP medication once picked up from pharmacy Olmesartan- HCTZ 40 mg -12.5 mg daily  Encouraged to go to upcoming PCP appointment, PCP referral placed for assistance to find local provider, current PCP is 1 hr drive Advised no salt diet, advised daily tracking of BP until PCP appointment , advised 30 minutes of daily physical activity  Strict precautions given for emergency department evaluation, verbalized understanding Final Clinical Impressions(s) / UC Diagnoses   Final diagnoses:  Elevated blood pressure reading in office with diagnosis of hypertension     Discharge Instructions      Take benicar tablet once daily   Follow up with doctor at upcoming appointment for evaluation of blood pressure  Take blood pressure daily around the same time and take record with you to upcoming appointment  Avoid salt in diet, do not add additional salt to meals  Continue to exercise, recommendations 30 minutes daily   At any point if blood pressure elevated with any associated symptoms of headache, visual changes, chest pain, shortness of breath, nausea, increased heartburn/indigestion, increased back pain please go to the nearest emergency department     ED Prescriptions     Medication Sig Dispense Auth. Provider   olmesartan-hydrochlorothiazide (BENICAR HCT) 40-12.5 MG tablet Take 1 tablet by mouth daily. 30 tablet Hans Eden, NP      PDMP not reviewed this encounter.   Hans Eden, NP 10/21/20 1056

## 2020-10-23 ENCOUNTER — Other Ambulatory Visit: Payer: Self-pay

## 2020-10-23 ENCOUNTER — Encounter (HOSPITAL_COMMUNITY): Payer: Self-pay

## 2020-10-23 ENCOUNTER — Emergency Department (HOSPITAL_COMMUNITY)
Admission: EM | Admit: 2020-10-23 | Discharge: 2020-10-24 | Disposition: A | Discharge status: Home / Self Care | Payer: 59 | Attending: Emergency Medicine | Admitting: Emergency Medicine

## 2020-10-23 DIAGNOSIS — Z79899 Other long term (current) drug therapy: Secondary | ICD-10-CM | POA: Diagnosis not present

## 2020-10-23 DIAGNOSIS — Z7982 Long term (current) use of aspirin: Secondary | ICD-10-CM | POA: Diagnosis not present

## 2020-10-23 DIAGNOSIS — D352 Benign neoplasm of pituitary gland: Secondary | ICD-10-CM

## 2020-10-23 DIAGNOSIS — R42 Dizziness and giddiness: Secondary | ICD-10-CM | POA: Insufficient documentation

## 2020-10-23 DIAGNOSIS — E237 Disorder of pituitary gland, unspecified: Secondary | ICD-10-CM

## 2020-10-23 DIAGNOSIS — I10 Essential (primary) hypertension: Secondary | ICD-10-CM | POA: Diagnosis present

## 2020-10-23 LAB — CBC WITH DIFFERENTIAL/PLATELET
Abs Immature Granulocytes: 0.01 10*3/uL (ref 0.00–0.07)
Basophils Absolute: 0 10*3/uL (ref 0.0–0.1)
Basophils Relative: 0 %
Eosinophils Absolute: 0.3 10*3/uL (ref 0.0–0.5)
Eosinophils Relative: 3 %
HCT: 40.2 % (ref 36.0–46.0)
Hemoglobin: 13.2 g/dL (ref 12.0–15.0)
Immature Granulocytes: 0 %
Lymphocytes Relative: 28 %
Lymphs Abs: 2.3 10*3/uL (ref 0.7–4.0)
MCH: 28.8 pg (ref 26.0–34.0)
MCHC: 32.8 g/dL (ref 30.0–36.0)
MCV: 87.6 fL (ref 80.0–100.0)
Monocytes Absolute: 0.5 10*3/uL (ref 0.1–1.0)
Monocytes Relative: 6 %
Neutro Abs: 5.1 10*3/uL (ref 1.7–7.7)
Neutrophils Relative %: 63 %
Platelets: 316 10*3/uL (ref 150–400)
RBC: 4.59 MIL/uL (ref 3.87–5.11)
RDW: 14.6 % (ref 11.5–15.5)
WBC: 8.2 10*3/uL (ref 4.0–10.5)
nRBC: 0 % (ref 0.0–0.2)

## 2020-10-23 NOTE — ED Provider Notes (Addendum)
[facility] DEPT Provider Note   CSN: [id number] Arrival date & time: 10/23/20  2040     History Chief Complaint  Patient presents with   Hypertension    Lori Snow is a 60 y.o. female.  Patient presents with elevated blood pressure.  States history of the same.  States her blood pressure has been elevated ever since she was taken off of her previous Benicar by a doctor she saw the urgent care.  She was switched to verapamil for about 2-1/2 weeks but stopped this on her own because of dizzy spells and leg swelling.  She was seen at urgent care 2 days ago and restarted on Benicar which she has been taking for the past 2 days.  Still reports elevated blood pressure 1 61-4 90 systolic.  States otherwise she feels well.  Denies any headache, visual changes, chest pain, shortness of breath, back pain, numbness, tingling, difficulty speaking, difficulty swallowing. She has been on Benicar again for the past 2 days.  States she was on no blood pressure medication for about 2 weeks before she was seen in urgent care on the fifth.  She was also given a dose of clonidine when she was in the urgent care. She comes in tonight because she is worried about her blood pressure still being elevated.  The history is provided by the patient.  Hypertension Pertinent negatives include no chest pain, no abdominal pain, no headaches and no shortness of breath.      Past Medical History:  Diagnosis Date   Hypertension     There are no problems to display for this patient.   Past Surgical History:  Procedure Laterality Date   TUBAL LIGATION       OB History     Gravida  5   Para  5   Term  5   Preterm      AB      Living  5      SAB      IAB      Ectopic      Multiple      Live Births              No family history on file.  Social History   Tobacco Use   Smoking status: Never   Smokeless tobacco: Never  Substance Use Topics    Alcohol use: No   Drug use: No    Home Medications Prior to Admission medications   Medication Sig Start Date End Date Taking? Authorizing Provider  aspirin EC 81 MG tablet Take 81 mg by mouth daily.    [provider]  Coenzyme Q10 (CO Q10 PO) Take by mouth.    [provider]  fish oil-omega-3 fatty acids 1000 MG capsule Take 2 g by mouth 3 (three) times daily.    [provider]  HAWTHORN PO Take by mouth.    [provider]  ketorolac (TORADOL) 10 MG tablet Take 1 tablet (10 mg total) by mouth every 6 (six) hours as needed for pain. Patient not taking: Reported on 05/09/2014 11/28/12   [name], [place], CNM  olmesartan-hydrochlorothiazide (BENICAR HCT) 40-12.5 MG tablet Take 1 tablet by mouth daily. 10/21/20   White, [name], NP  saccharomyces boulardii (FLORASTOR) 250 MG capsule Take 250 mg by mouth 2 (two) times daily.    [provider]  UNABLE TO FIND Med Name: pt reports taking vitamin D3    [provider]  Karen Kays  TO FIND Med Name: pt reports taking potassium, magnesium, and bromelain    [provider]  UNABLE TO FIND Med Name: pt reports taking verapamil but unsure of formulation/dose. States caused her to swell in legs- states has been out for about a week.    [provider]  Vitamin C, Calcium Ascorbate, SOLR Take 500 mg by mouth daily.    [provider]    Allergies    Sulfa antibiotics, Sulfamethoxazole-trimethoprim, Lisinopril, Shrimp (diagnostic), Amlodipine, and Septra [bactrim]  Review of Systems   Review of Systems  Constitutional:  Negative for activity change, appetite change and fever.  HENT:  Negative for congestion and rhinorrhea.   Respiratory:  Negative for cough, chest tightness and shortness of breath.   Cardiovascular:  Negative for chest pain.  Gastrointestinal:  Negative for abdominal pain, nausea and vomiting.  Genitourinary:  Negative for dysuria and hematuria.   Musculoskeletal:  Negative for arthralgias and myalgias.  Skin:  Negative for rash.  Neurological:  Negative for dizziness, weakness, light-headedness and headaches.   all other systems are negative except as noted in the HPI and PMH.   Physical Exam Updated Vital Signs BP (!) 200/114 (BP Location: Right Arm)   Pulse 81   Temp 99.2 F (37.3 C) (Oral)   Resp 16   Ht 5\' 9"  (1.753 m)   Wt 103.1 kg   LMP 03/10/2014   SpO2 97%   BMI 33.57 kg/m   Physical Exam Vitals and nursing note reviewed.  Constitutional:      General: She is not in acute distress.    Appearance: Normal appearance. She is well-developed and normal weight. She is not ill-appearing.  HENT:     Head: Normocephalic and atraumatic.     Mouth/Throat:     Pharynx: No oropharyngeal exudate.  Eyes:     Conjunctiva/sclera: Conjunctivae normal.     Pupils: Pupils are equal, round, and reactive to light.  Neck:     Comments: No meningismus. Cardiovascular:     Rate and Rhythm: Normal rate and regular rhythm.     Heart sounds: Normal heart sounds. No murmur heard. Pulmonary:     Effort: Pulmonary effort is normal. No respiratory distress.     Breath sounds: Normal breath sounds.  Abdominal:     Palpations: Abdomen is soft.     Tenderness: There is no abdominal tenderness. There is no guarding or rebound.  Musculoskeletal:        General: No tenderness. Normal range of motion.     Cervical back: Normal range of motion and neck supple.  Skin:    General: Skin is warm.  Neurological:     Mental Status: She is alert and oriented to person, place, and time.     Cranial Nerves: No cranial nerve deficit.     Motor: No abnormal muscle tone.     Coordination: Coordination normal.     Comments:  5/5 strength throughout. CN 2-12 intact.Equal grip strength.   Psychiatric:        Behavior: Behavior normal.    ED Results / Procedures / Treatments   Labs (all labs ordered are listed, but only abnormal results are  displayed) Labs Reviewed  BASIC METABOLIC PANEL - Abnormal; Notable for the following components:      Result Value   Glucose, Bld 100 (*)    All other components within normal limits  URINALYSIS, ROUTINE W REFLEX MICROSCOPIC - Abnormal; Notable for the following components:   Leukocytes,Ua MODERATE (*)  Bacteria, UA RARE (*)    All other components within normal limits  CBC WITH DIFFERENTIAL/PLATELET  TROPONIN I (HIGH SENSITIVITY)  TROPONIN I (HIGH SENSITIVITY)    EKG EKG Interpretation  Date/Time:  Thursday October 23 2020 23:44:29 EDT Ventricular Rate:  67 PR Interval:  130 QRS Duration: 84 QT Interval:  394 QTC Calculation: 416 R Axis:   -7 Text Interpretation: Normal sinus rhythm Minimal voltage criteria for LVH, may be normal variant ( R in aVL ) Nonspecific ST abnormality Abnormal ECG No significant change was found Confirmed by Ezequiel Essex [phone]) on 10/23/2020 11:56:48 PM  Radiology CT Head Wo Contrast  Result Date: 10/24/2020 CLINICAL DATA:  Headaches and hypertension. EXAM: CT HEAD WITHOUT CONTRAST TECHNIQUE: Contiguous axial images were obtained from the base of the skull through the vertex without intravenous contrast. COMPARISON:  None. FINDINGS: Brain: Expansion of the sella turcica with sellar and suprasellar mass measuring 2.3 x 2.5 x 2.7 cm diameter. 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 Consider MRI for further evaluation. Otherwise, there is no mass effect or midline shift. No abnormal extra-axial fluid collections. No ventricular dilatation. Gray-white matter junctions are distinct. Basal cisterns are not effaced. No acute intracranial hemorrhage. Vascular: No hyperdense vessel or unexpected calcification. Skull: Normal. Negative for fracture or focal lesion. Sinuses/Orbits: No acute finding. Other: None. IMPRESSION: 1. 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 MRI is suggested for follow-up. 2. Otherwise,  no acute intracranial abnormality is demonstrated. Electronically Signed   By: Lucienne Capers M.D.   On: 10/24/2020 03:20    Procedures Procedures   Medications Ordered in ED Medications - No data to display  ED Course  I have reviewed the triage vital signs and the nursing notes.  Pertinent labs & imaging results that were available during my care of the patient were reviewed by me and considered in my medical decision making (see chart for details).    MDM Rules/Calculators/A&P                         Elevated blood pressure without any other symptoms.   normal neurological exam.  Labs show no evidence of endorgan damage.  Troponin negative x2, EKG is normal sinus rhythm.  Patient given hydralazine in the ED.  CT scan results discussed with Dr. Gerilyn Nestle of radiology.  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  Recommends CTA to rule out aneurysm.  Will need nonemergent MRI and neurosurgery follow-up.  Blood pressure has improved to the 561 systolic.  Labs are reassuring without evidence of endorgan damage. Advised to keep log of blood pressures and followup with PCP for further medication adjustments.  CTA pending at shift change for further evaluation of pituitary abnormality. Patient informed of findings and need for MRI. D/w neurosurgery team who will arrange followup with Dr. Zada Finders.   Dr. Billy Fischer to assume care at shift change Final Clinical Impression(s) / ED Diagnoses Final diagnoses:  None    Rx / DC Orders ED Discharge Orders     None        Kingsly Kloepfer, Annie Main, MD 10/24/20 [id number]    Ezequiel Essex, MD 10/30/20 [id number]

## 2020-10-23 NOTE — ED Triage Notes (Signed)
Pt reports recurrent hypertension. Seen at UC 2 days ago and given RX. Denies any sx.

## 2020-10-24 ENCOUNTER — Emergency Department (HOSPITAL_COMMUNITY): Payer: 59

## 2020-10-24 LAB — BASIC METABOLIC PANEL
Anion gap: 11 (ref 5–15)
BUN: 19 mg/dL (ref 6–20)
CO2: 24 mmol/L (ref 22–32)
Calcium: 9.6 mg/dL (ref 8.9–10.3)
Chloride: 105 mmol/L (ref 98–111)
Creatinine, Ser: 0.85 mg/dL (ref 0.44–1.00)
GFR, Estimated: >60 mL/min (ref >60)
Glucose, Bld: 100 mg/dL — ABNORMAL HIGH (ref 70–99)
Potassium: 3.6 mmol/L (ref 3.5–5.1)
Sodium: 140 mmol/L (ref 135–145)

## 2020-10-24 LAB — TROPONIN I (HIGH SENSITIVITY)
Troponin I (High Sensitivity): 5 ng/L (ref <18)
Troponin I (High Sensitivity): 4 ng/L (ref <18)

## 2020-10-24 LAB — URINALYSIS, ROUTINE W REFLEX MICROSCOPIC
Bilirubin Urine: NEGATIVE
Glucose, UA: NEGATIVE mg/dL
Hgb urine dipstick: NEGATIVE
Ketones, ur: NEGATIVE mg/dL
Nitrite: NEGATIVE
Protein, ur: NEGATIVE mg/dL
Specific Gravity, Urine: 1.013 (ref 1.005–1.030)
pH: 5 (ref 5.0–8.0)

## 2020-10-24 MED ORDER — SODIUM CHLORIDE (PF) 0.9 % IJ SOLN
INTRAMUSCULAR | Status: AC
  Filled 2020-10-24: qty 50

## 2020-10-24 MED ORDER — LABETALOL HCL 100 MG PO TABS: 100.0000 mg | ORAL_TABLET | Freq: Two times a day (BID) | ORAL | 0 refills | Status: AC

## 2020-10-24 MED ORDER — IOHEXOL 350 MG/ML SOLN
100.0000 mL | Freq: Once | INTRAVENOUS | Status: AC | PRN
  Administered 2020-10-24: 100 mL via INTRAVENOUS

## 2020-10-24 MED ORDER — HYDROCHLOROTHIAZIDE 12.5 MG PO TABS: 12.5000 mg | ORAL_TABLET | Freq: Every day | ORAL | 0 refills | Status: AC

## 2020-10-24 MED ORDER — HYDRALAZINE HCL 20 MG/ML IJ SOLN
5.0000 mg | Freq: Once | INTRAMUSCULAR | Status: AC
  Administered 2020-10-24: 5 mg via INTRAVENOUS
  Filled 2020-10-24: qty 1

## 2020-10-24 NOTE — ED Notes (Signed)
Patient transported to CT 

## 2020-10-24 NOTE — Discharge Instructions (Addendum)
Follow-up with your doctor regarding elevated blood pressure.  Keep a record of your blood pressure see your doctor can make medication adjustments.  Your CT scan is abnormal and shows a possible adenoma of your pituitary gland.  You need to have an MRI for further evaluation of this and follow-up with a neurosurgeon and neurologist.

## 2020-10-24 NOTE — ED Notes (Signed)
Pt ambulated to restroom independently without no issues.

## 2023-01-12 IMAGING — CT CT HEAD W/O CM
3 series · 15 of 45 positions shown, 18 images · non-contrast
Comparison: None.

CLINICAL DATA: Headaches and hypertension.

EXAM:
CT HEAD WITHOUT CONTRAST
TECHNIQUE: Contiguous axial images were obtained from the base of the skull
through the vertex without intravenous contrast.

[Series 2: head wo · axial · 0.47mm/px · z∈[+1592,+1707]mm · 9 of 28 slices shown, 12 images]
[im 3/28  brain]
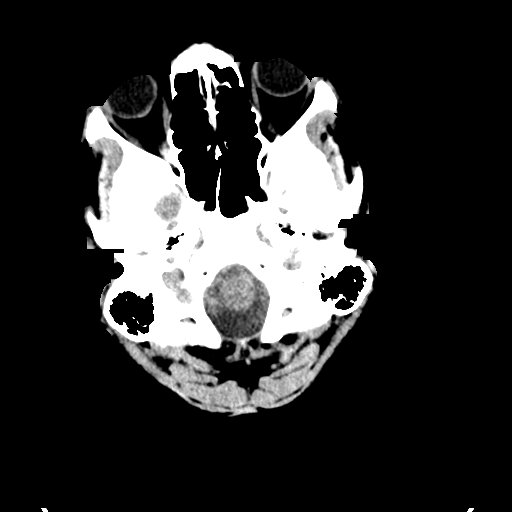
[im 3/28  bone]
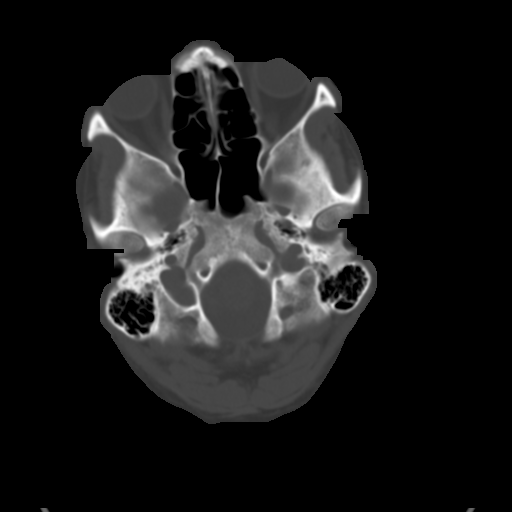
[im 6/28  brain]
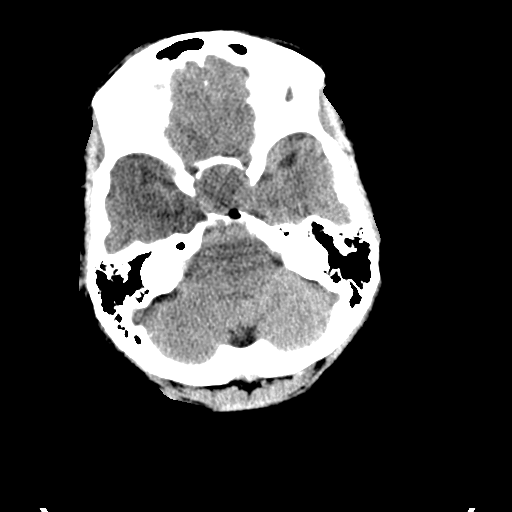
[im 9/28  brain]
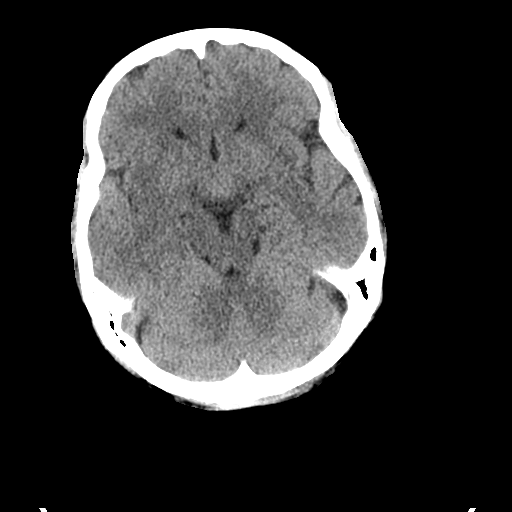
[im 12/28  brain]
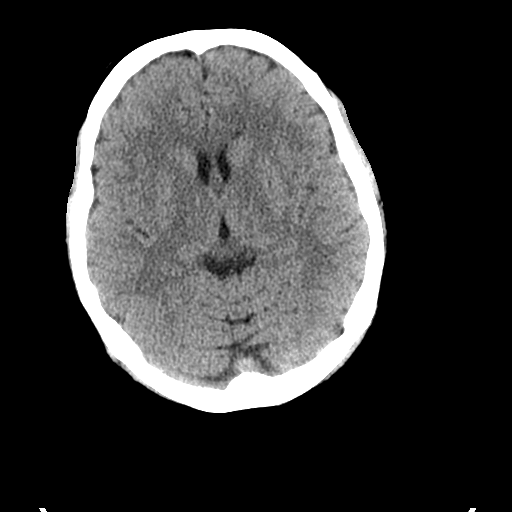
[im 15/28  brain]
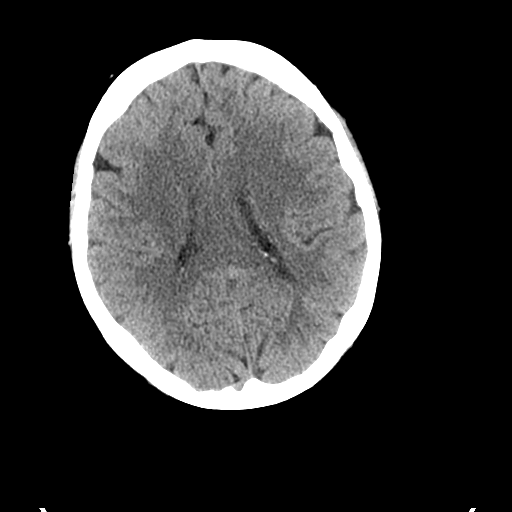
[im 15/28  bone]
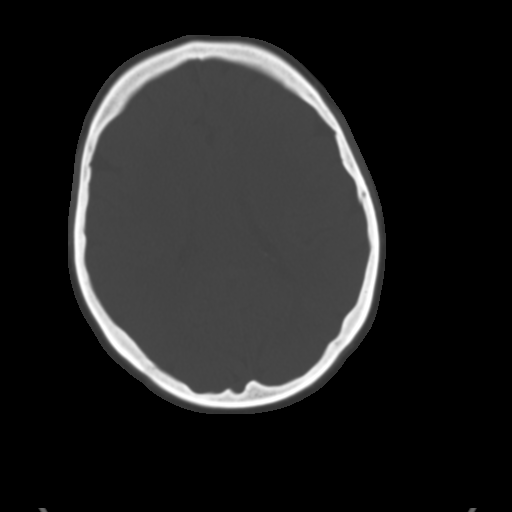
[im 17/28  brain]
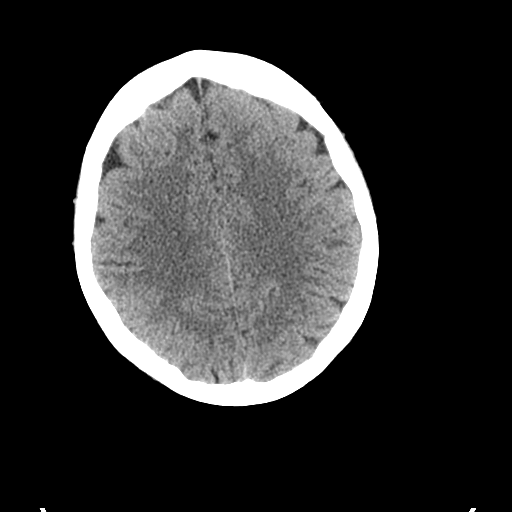
[im 20/28  brain]
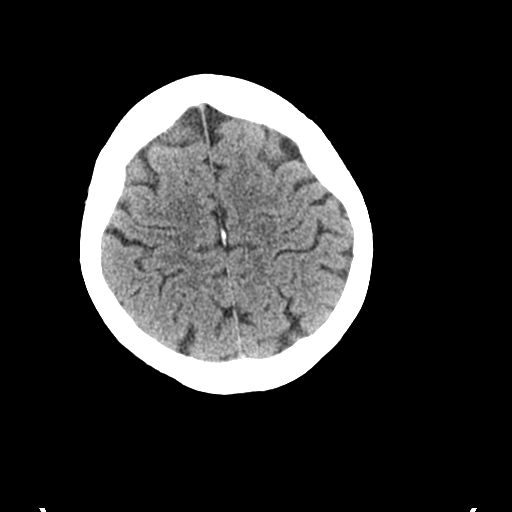
[im 23/28  brain]
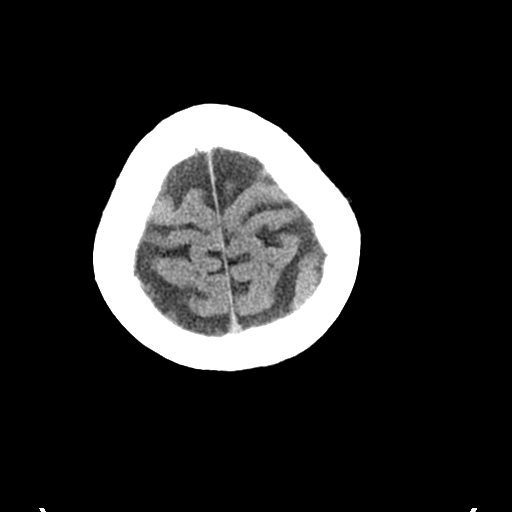
[im 26/28  brain]
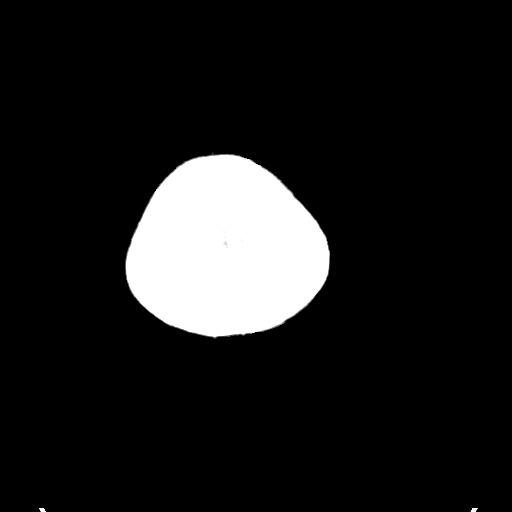
[im 26/28  bone]
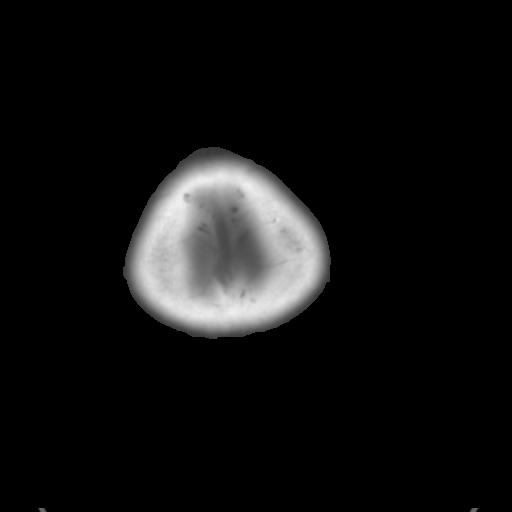

[Series 4: coronal soft tissue · coronal · 0.27mm/px · 3 of 63 slices shown]
[im 21/63  brain]
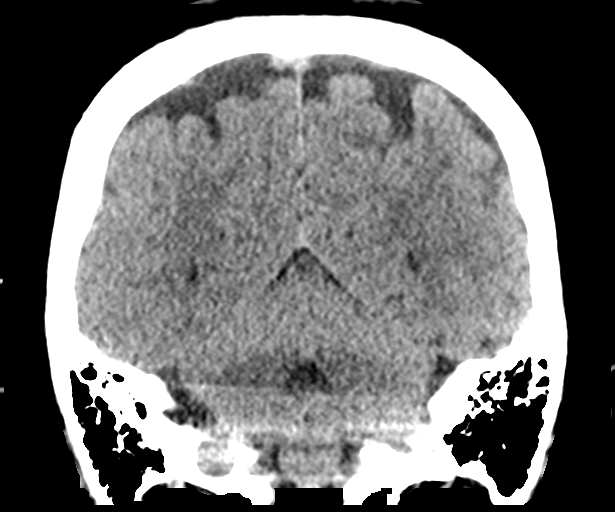
[im 28/63  brain]
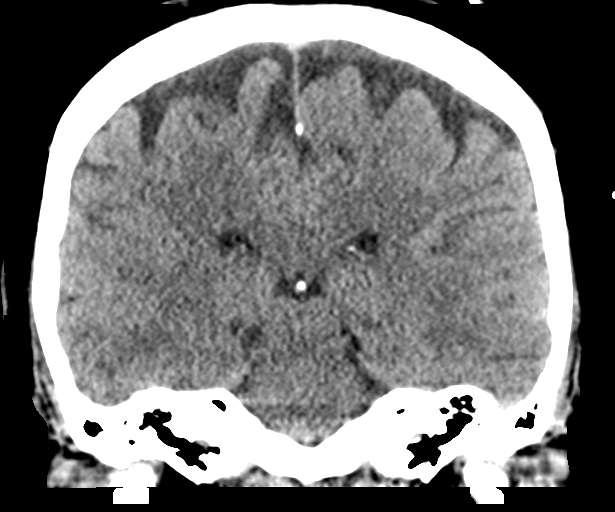
[im 35/63  brain]
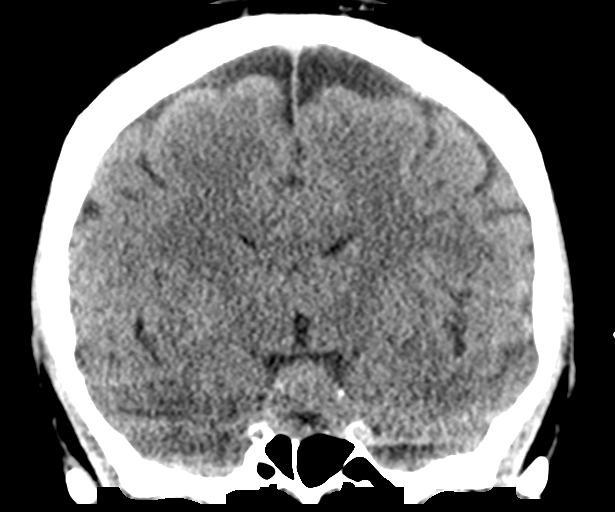

[Series 5: sagittal soft tissue · sagittal · 0.28mm/px · 3 of 54 slices shown]
[im 18/54  brain]
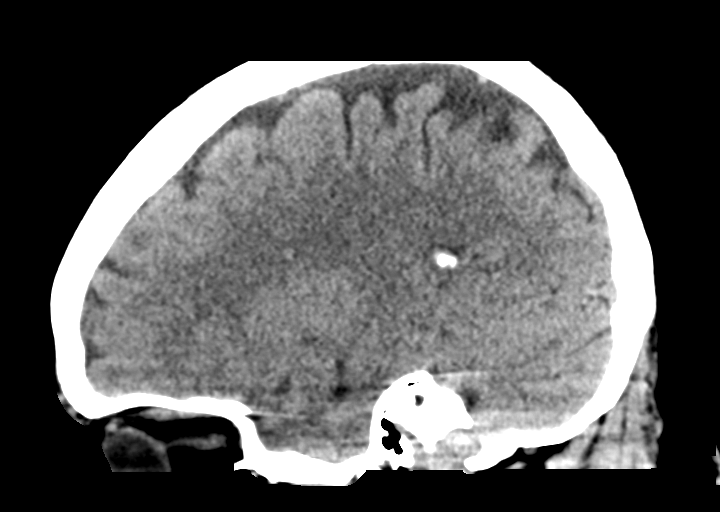
[im 27/54  brain]
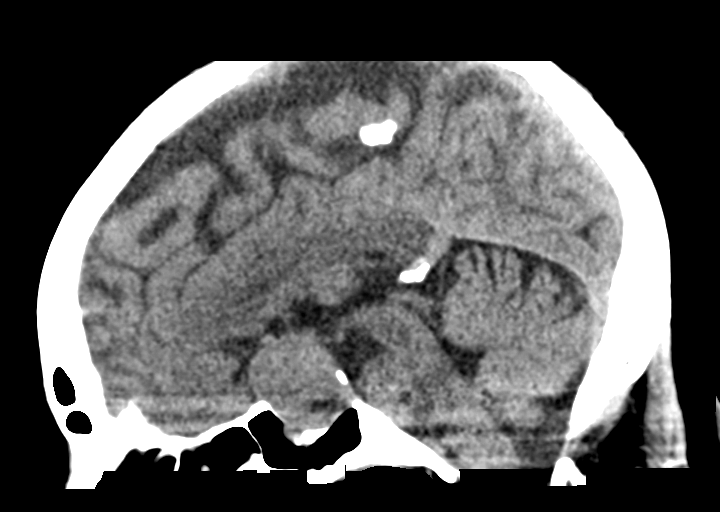
[im 36/54  brain]
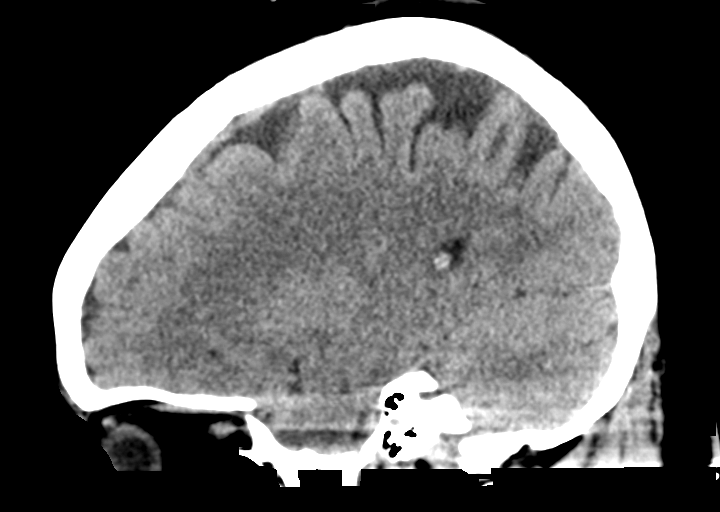

[15 of 45 positions shown; findings below may reference images not displayed]

FINDINGS: Brain: Expansion of the sella turcica with sellar and suprasellar
mass measuring 2.3 x 2.5 x 2.7 cm diameter. This likely represents a
pituitary macro adenoma. Consider MRI for further evaluation.

Otherwise, there is no mass effect or midline shift. No abnormal
extra-axial fluid collections. No ventricular dilatation. Gray-white
matter junctions are distinct. Basal cisterns are not effaced. No
acute intracranial hemorrhage.

Vascular: No hyperdense vessel or unexpected calcification.

Skull: Normal. Negative for fracture or focal lesion.

Sinuses/Orbits: No acute finding.

Other: None.
IMPRESSION: 1. Large sellar and suprasellar mass lesion likely representing a
pituitary macro adenoma. MRI is suggested for follow-up.
2. Otherwise, no acute intracranial abnormality is demonstrated.
